# Patient Record
Sex: Female | Born: 1960 | Hispanic: No
Health system: Southern US, Community
[De-identification: ages and names within clinical notes are randomized; demographics above are authoritative.]

---

## 1999-01-08 ENCOUNTER — Other Ambulatory Visit: Admission: RE | Admit: 1999-01-08 | Discharge: 1999-01-08 | Payer: Self-pay | Admitting: Obstetrics & Gynecology

## 1999-04-05 ENCOUNTER — Ambulatory Visit: Admission: RE | Admit: 1999-04-05 | Discharge: 1999-04-05 | Payer: Self-pay

## 2002-10-31 ENCOUNTER — Other Ambulatory Visit: Admission: RE | Admit: 2002-10-31 | Discharge: 2002-10-31 | Payer: Self-pay | Admitting: Obstetrics & Gynecology

## 2003-10-30 ENCOUNTER — Other Ambulatory Visit: Admission: RE | Admit: 2003-10-30 | Discharge: 2003-10-30 | Payer: Self-pay | Admitting: Obstetrics & Gynecology

## 2011-08-04 ENCOUNTER — Ambulatory Visit (HOSPITAL_BASED_OUTPATIENT_CLINIC_OR_DEPARTMENT_OTHER): Payer: Managed Care, Other (non HMO) | Attending: Otolaryngology | Admitting: Radiology

## 2011-08-04 VITALS — Ht 67.0 in | Wt 235.0 lb

## 2011-08-04 DIAGNOSIS — G4733 Obstructive sleep apnea (adult) (pediatric): Secondary | ICD-10-CM | POA: Insufficient documentation

## 2011-08-07 DIAGNOSIS — G4733 Obstructive sleep apnea (adult) (pediatric): Secondary | ICD-10-CM

## 2011-08-07 DIAGNOSIS — R0989 Other specified symptoms and signs involving the circulatory and respiratory systems: Secondary | ICD-10-CM

## 2011-08-07 DIAGNOSIS — R0609 Other forms of dyspnea: Secondary | ICD-10-CM

## 2011-08-08 NOTE — Procedures (Signed)
NAMEROBIE, OATS               ACCOUNT NO.:  [id number]  MEDICAL RECORD NO.:  [id number]          PATIENT TYPE:  OUT  LOCATION:  SLEEP CENTER                 FACILITY:  [facility]  PHYSICIAN:  Zacaria Pousson D. Maple Hudson, MD, FCCP, FACPDATE OF BIRTH:  1960/09/23  DATE OF STUDY:  08/04/2011                           NOCTURNAL POLYSOMNOGRAM  REFERRING PHYSICIAN:  Zola Button T. Lazarus Salines, M.D.  INDICATION FOR STUDY:  Hypersomnia with sleep apnea.  EPWORTH SLEEPINESS SCORE:  18/24.  BMI 36.8.  Weight 235 pounds.  Height 67 inches.  Neck 13.5 inch.  MEDICATIONS:  Home medications charted and reviewed.  SLEEP ARCHITECTURE:  Total sleep time 339 minutes with sleep efficiency 91.5%.  Stage I was 4.9%, stage II 78.2%, stage III 0.3%.  REM 16.7% of total sleep time.  Sleep latency 8 minutes.  REM latency 90.5 minutes. Awake after sleep onset 23.5 minutes.  Arousal index 28.5.  BEDTIME MEDICATION:  None.  RESPIRATORY DATA:  Apnea-hypotony index (AHI) 11.9 per hour.  A total of 67 events were scored including 7 obstructive apneas, 2 central apneas, 58 hypopneas.  Events were more commonly seen while supine.  REM AHI 31.9 per hour.  There were insufficient numbers of early events to permit application of split protocol, CPAP titration on the study night.  OXYGEN DATA:  Moderately loud snoring (described by the technician as loud, wet, gurgling, and continuous) with oxygen desaturation to a nadir of 77% and mean saturation through the study of 95% on room air.  CARDIAC DATA:  Sinus rhythm with occasional PAC and PVC.  MOVEMENT-PARASOMNIA:  No significant movement disturbance.  No bathroom trips.  IMPRESSIONS-RECOMMENDATIONS: 1. Mild obstructive sleep apnea/hypopnea syndrome, apnea-hypopnea     index 11.9 per hour with mainly supine and rapid eye movement     associated events.  Moderately loud snoring described by the     technician as "loud, wet, gurgling, continuous snoring" with oxygen  desaturation to a nadir of 77% and mean oxygen saturation through     the study of 95% on room air. 2. There were insufficient numbers of early events to meet protocol     requirements for application of split study     protocol, CPAP titration on this study night.  Consider return for     dedicated CPAP titration study or evaluate for alternative     management as clinically appropriate.     Kenady Doxtater D. Maple Hudson, MD, [facility], FACP Diplomate, American Board of Sleep Medicine    CDY/MEDQ  D:  08/07/2011 12:49:04  T:  08/08/2011 03:48:15  Job:  [id number]

## 2011-09-05 ENCOUNTER — Ambulatory Visit (HOSPITAL_BASED_OUTPATIENT_CLINIC_OR_DEPARTMENT_OTHER): Payer: Managed Care, Other (non HMO) | Attending: Otolaryngology | Admitting: General Practice

## 2011-09-05 VITALS — Ht 67.0 in | Wt 235.0 lb

## 2011-09-05 DIAGNOSIS — Z9989 Dependence on other enabling machines and devices: Secondary | ICD-10-CM

## 2011-09-05 DIAGNOSIS — G4733 Obstructive sleep apnea (adult) (pediatric): Secondary | ICD-10-CM

## 2011-09-05 DIAGNOSIS — G4730 Sleep apnea, unspecified: Secondary | ICD-10-CM | POA: Insufficient documentation

## 2011-09-05 DIAGNOSIS — G4710 Hypersomnia, unspecified: Secondary | ICD-10-CM | POA: Insufficient documentation

## 2011-09-11 DIAGNOSIS — G4730 Sleep apnea, unspecified: Secondary | ICD-10-CM

## 2011-09-11 DIAGNOSIS — G4710 Hypersomnia, unspecified: Secondary | ICD-10-CM

## 2011-09-11 NOTE — Procedures (Signed)
NAMEMERRYL, BUCKELS               ACCOUNT NO.:  [id number]  MEDICAL RECORD NO.:  [id number]          PATIENT TYPE:  OUT  LOCATION:  SLEEP CENTER                 FACILITY:  [facility]  PHYSICIAN:  Abdurahman Rugg D. Maple Hudson, MD, FCCP, FACPDATE OF BIRTH:  10[birth year]Aug-1960  DATE OF STUDY:  09/05/2011                           NOCTURNAL POLYSOMNOGRAM  REFERRING PHYSICIAN:  Zola Button T. Lazarus Salines, M.D.  REFERRING PHYSICIAN:  Zola Button T. Wolicki, MD  INDICATION FOR STUDY:  Hypersomnia with sleep apnea.  EPWORTH SLEEPINESS SCORE:  18/24.  BMI 36.8, weight 235 pounds, height 67 inches, neck 13.5 inches.  MEDICATIONS:  Home medications are charted and reviewed.  A baseline diagnostic NPSG on August 04, 2011, recorded an AHI of 11.9 per hour.  CPAP titration is now requested.  SLEEP ARCHITECTURE:  Total sleep time 414 minutes with sleep efficiency 94.2%.  Stage I was 3%, stage II 65.9%, stage III 1.9%, REM 29.1% of total sleep time.  Sleep latency 6 minutes, REM latency 115.5 minutes, awake after sleep onset 19.5 minutes, arousal index 7.8.  Bedtime medication:  None.  RESPIRATORY DATA:  CPAP titration protocol.  CPAP was titrated to 9 CWP, AHI 1 per hour.  She wore a standard ResMed Mirage FX mask with heated humidifier and a C-Flex setting of 3.  OXYGEN DATA:  Snoring was prevented by final CPAP pressures and mean oxygen saturation held 96.2% on room air.  CARDIAC DATA:  Sinus rhythm with rare PVC.  MOVEMENT-PARASOMNIA:  No significant movement disturbance.  No bathroom trips.  IMPRESSIONS-RECOMMENDATION: 1. Successful continuous positive airway pressure titration to 9 cm of     water pressure, apnea/hypopnea index 1 per hour.  She wore a     standard ResMed Mirage FX full-face mask with heated humidifier and     a C-Flex setting of 3.  Snoring was prevented and mean oxygen     saturation held 96.2% on room air. 2. Baseline diagnostic nocturnal polysomnogram on August 04, 2011,     had recorded an  apnea/hypopnea index of 11.9 per hour.     Jessup Ogas D. Maple Hudson, MD, [facility], FACP Diplomate, American Board of Sleep Medicine    CDY/MEDQ  D:  09/11/2011 11:44:40  T:  09/11/2011 12:22:36  Job:  [id number]

## 2016-07-01 ENCOUNTER — Other Ambulatory Visit (HOSPITAL_BASED_OUTPATIENT_CLINIC_OR_DEPARTMENT_OTHER): Payer: Self-pay

## 2016-07-01 DIAGNOSIS — R0683 Snoring: Secondary | ICD-10-CM

## 2016-07-01 DIAGNOSIS — G4730 Sleep apnea, unspecified: Secondary | ICD-10-CM

## 2016-07-27 ENCOUNTER — Ambulatory Visit (HOSPITAL_BASED_OUTPATIENT_CLINIC_OR_DEPARTMENT_OTHER): Payer: Managed Care, Other (non HMO) | Attending: Otolaryngology | Admitting: Internal Medicine

## 2016-07-27 DIAGNOSIS — G4733 Obstructive sleep apnea (adult) (pediatric): Secondary | ICD-10-CM | POA: Insufficient documentation

## 2016-07-27 DIAGNOSIS — R0683 Snoring: Secondary | ICD-10-CM

## 2016-07-27 DIAGNOSIS — G4730 Sleep apnea, unspecified: Secondary | ICD-10-CM

## 2016-07-27 DIAGNOSIS — R0902 Hypoxemia: Secondary | ICD-10-CM | POA: Insufficient documentation

## 2016-08-01 DIAGNOSIS — R0683 Snoring: Secondary | ICD-10-CM

## 2016-08-01 NOTE — Procedures (Signed)
   Patient Name: Katelyn Mccarty, [name] Study Date: 07/27/2016 Gender: Female D.O.B: Dec 19, 1960 Age (years): 55 Referring Provider: Flo ShanksKarol Wolicki Height (inches): 65 Interpreting Physician: Jetty Duhamellinton Jaire Pinkham MD, ABSM Weight (lbs): 205 RPSGT:  [place], [place] BMI: 34 MRN: [id number] Neck Size: 14.50 CLINICAL INFORMATION Sleep Study Type: HST  Indication for sleep study: Snoring  Epworth Sleepiness Score: 20  SLEEP STUDY TECHNIQUE A multi-channel overnight portable sleep study was performed. The channels recorded were: nasal airflow, thoracic respiratory movement, and oxygen saturation with a pulse oximetry. Snoring was also monitored.  MEDICATIONS Patient self administered medications include: none reported during study  SLEEP ARCHITECTURE Patient was studied for 554.0 minutes. The sleep efficiency was 99.1 % and the patient was supine for 53.4%. The arousal index was 0.0 per hour.  RESPIRATORY PARAMETERS The overall AHI was 34.2 per hour, with a central apnea index of 0.0 per hour.  The oxygen nadir was 76% during sleep.  CARDIAC DATA Mean heart rate during sleep was 68.7 bpm.  IMPRESSIONS - Severe obstructive sleep apnea occurred during this study (AHI = 34.2/h). - No significant central sleep apnea occurred during this study (CAI = 0.0/h). - Severe oxygen desaturation was noted during this study (Min O2 = 76%, Mean 94%). - Patient snored .  DIAGNOSIS - Obstructive Sleep Apnea (327.23 [G47.33 ICD-10]) - Nocturnal Hypoxemia (327.26 [G47.36 ICD-10])  RECOMMENDATIONS - Return to provider to discuss treatment options. - Positional therapy avoiding supine position during sleep. - Avoid alcohol, sedatives and other CNS depressants that may worsen sleep apnea and disrupt normal sleep architecture. - Sleep hygiene should be reviewed to assess factors that may improve sleep quality. - Weight management and regular exercise should be initiated or continued.  [Electronically  signed] 08/01/2016 01:14 PM  Jetty Duhamellinton Juan Olthoff MD, ABSM Diplomate, American Board of Sleep Medicine   NPI: [id number] Waymon BudgeYOUNG,Panda Crossin D Diplomate, American Board of Sleep Medicine  ELECTRONICALLY SIGNED ON:  08/01/2016, 1:10 PM [facility] PH: [phone]   FX: [phone] ACCREDITED BY THE AMERICAN ACADEMY OF SLEEP MEDICINE

## 2019-01-25 ENCOUNTER — Other Ambulatory Visit: Payer: Self-pay | Admitting: Gastroenterology

## 2019-01-25 DIAGNOSIS — R112 Nausea with vomiting, unspecified: Secondary | ICD-10-CM

## 2019-02-02 ENCOUNTER — Ambulatory Visit
Admission: RE | Admit: 2019-02-02 | Discharge: 2019-02-02 | Disposition: A | Discharge status: Home / Self Care | Payer: 59 | Source: Ambulatory Visit | Attending: Gastroenterology | Admitting: Gastroenterology

## 2019-02-02 ENCOUNTER — Other Ambulatory Visit: Payer: Self-pay | Admitting: Gastroenterology

## 2019-02-02 DIAGNOSIS — R112 Nausea with vomiting, unspecified: Secondary | ICD-10-CM

## 2020-08-26 IMAGING — RF UPPER GI W/ SMALL BOWEL
5 series · 13 of 24 positions shown · non-contrast
Comparison: Abdominopelvic CT 01/27/2017.

CLINICAL DATA: Intractable nausea and vomiting. History of
laparoscopic gastric banding, cholecystectomy and hernia repair.

EXAM:
UPPER GI SERIES WITH SMALL BOWEL FOLLOW-THROUGH
FLUOROSCOPY TIME:  Fluoroscopy Time: 2 minutes and 42 seconds of
low-dose pulsed fluoroscopy.
Radiation Exposure Index (if provided by the fluoroscopic device):
493.8 mGy
Number of Acquired Spot Images: 0
TECHNIQUE: Combined double contrast and single contrast upper GI series using
effervescent crystals, thick barium, and thin barium. Subsequently,
serial images of the small bowel were obtained including spot views
of the terminal ileum.

[Series 1: one shot · 0.14mm/px · 3 of 12 slices shown (1 of 3)]
[im 1/12]
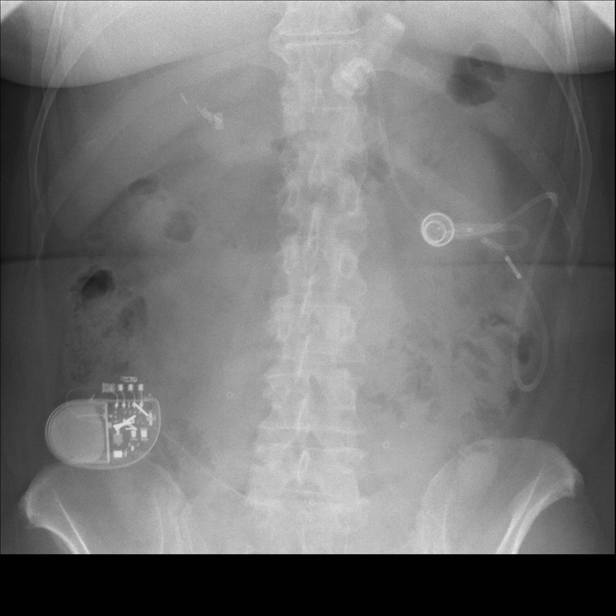
[im 6/12]
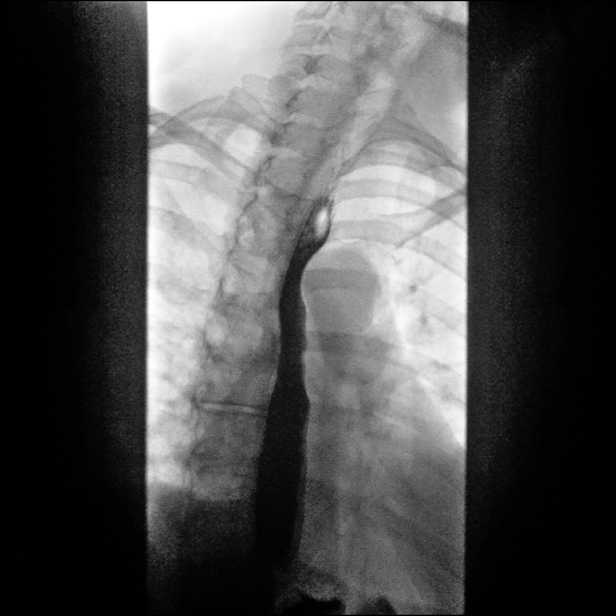
[im 10/12]
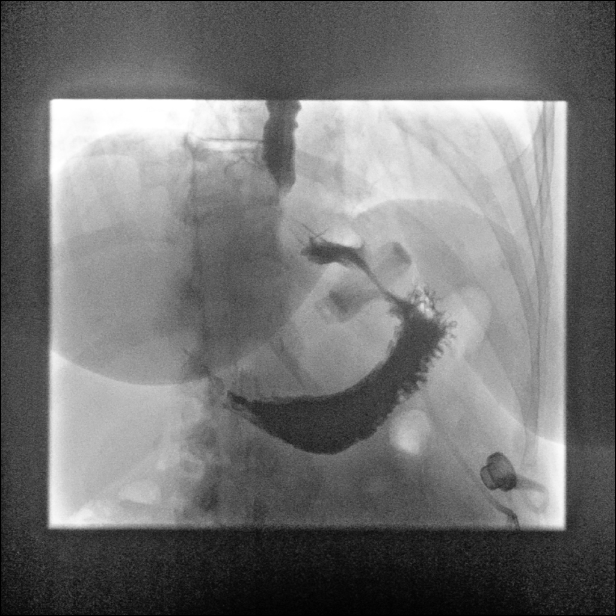

[Series 2: sequence · 2 of 26 frames shown (1 of 2)]
[frame 14/26]
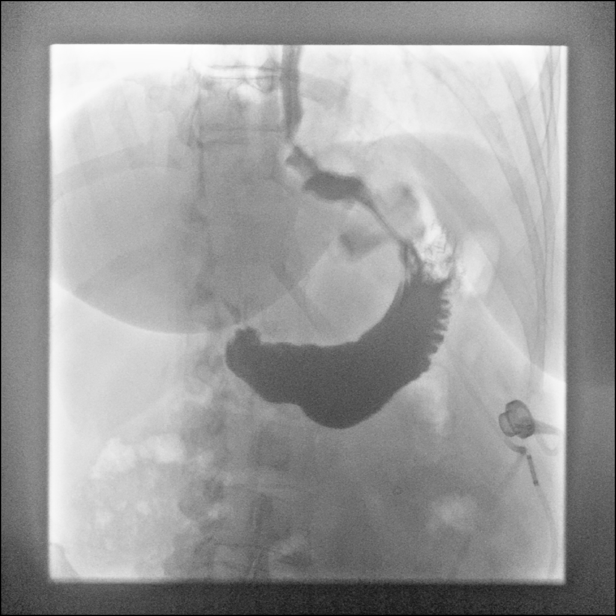
[frame 23/26]
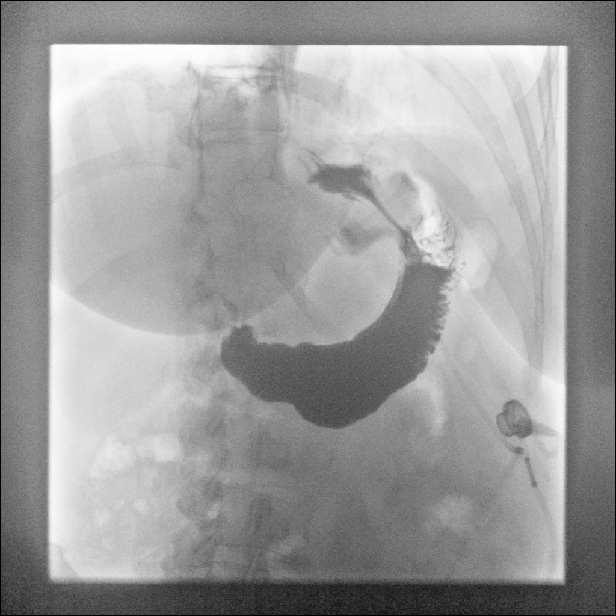

[Series 3: one shot · 2 of 8 slices shown (2 of 3)]
[im 4/8]
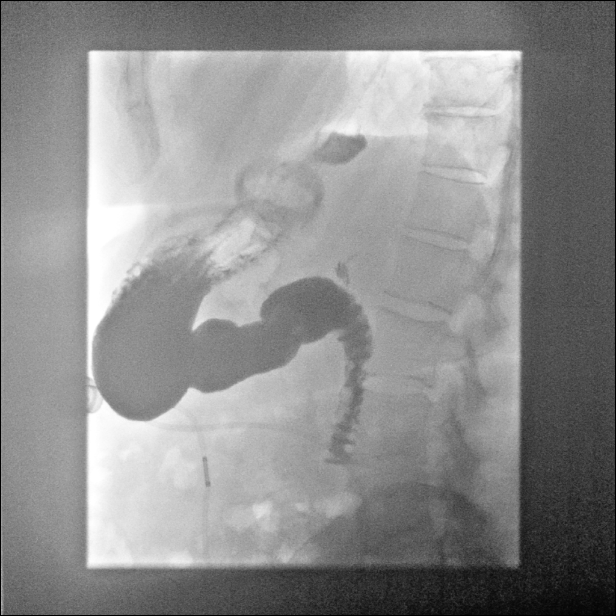
[im 8/8]
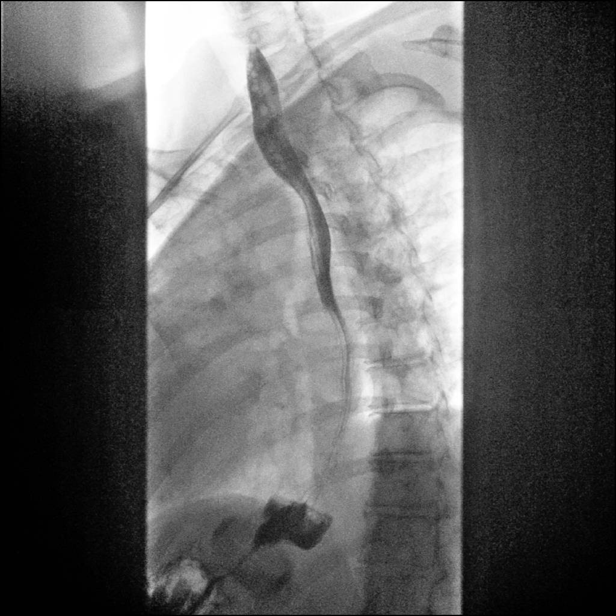

[Series 4: sequence · 2 of 49 frames shown (2 of 2)]
[frame 8/49]
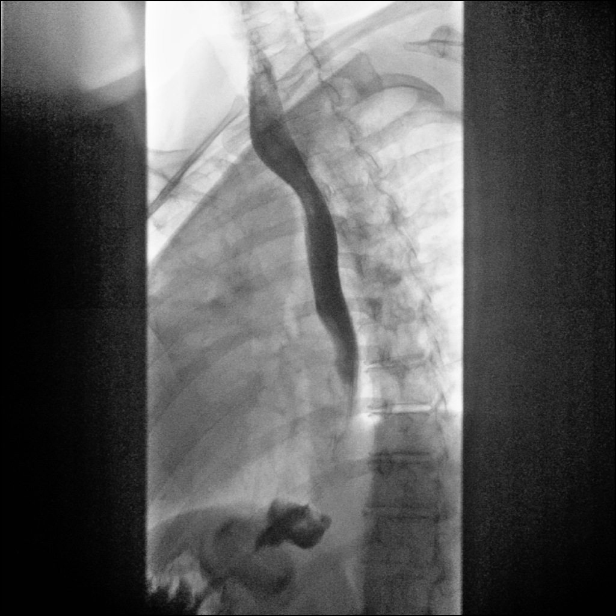
[frame 42/49]
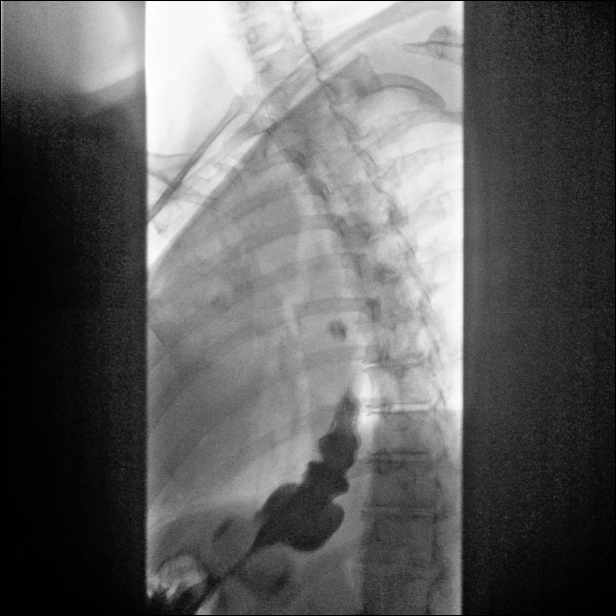

[Series 5: one shot · 4 of 17 slices shown (3 of 3)]
[im 2/17]
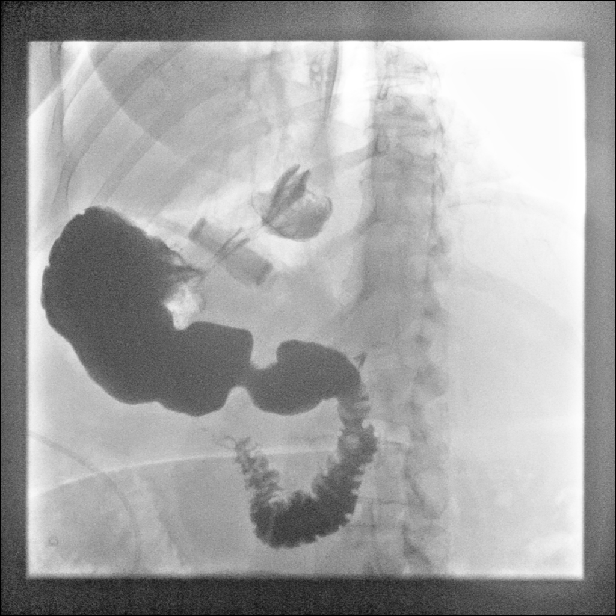
[im 8/17]
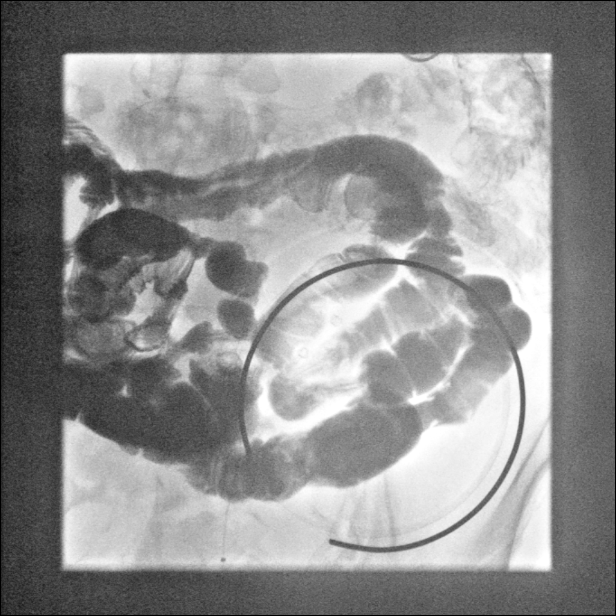
[im 11/17]
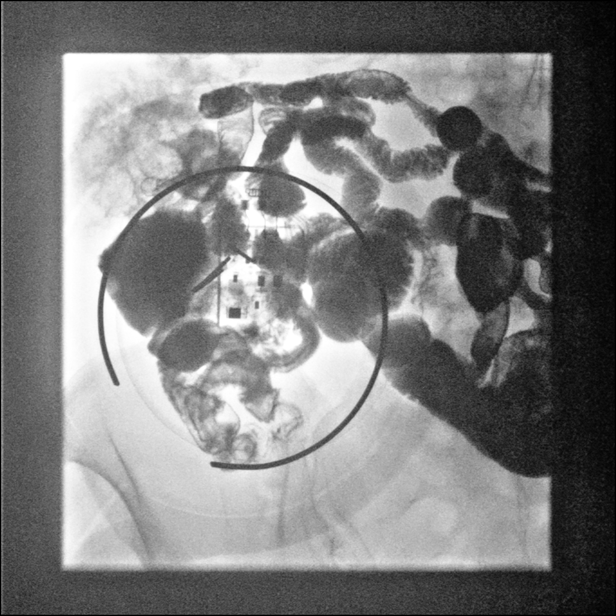
[im 17/17]
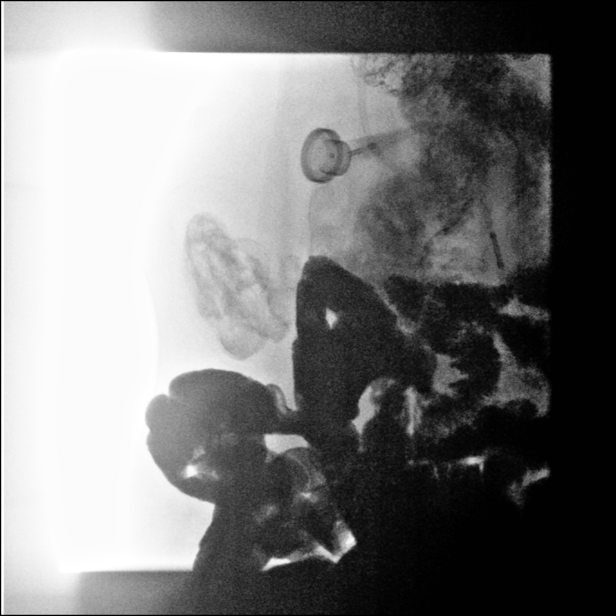

[13 of 24 positions shown; findings below may reference images not displayed]

FINDINGS: The scout abdominal radiograph demonstrates a normal bowel gas
pattern. The laparoscopic gastric band is appropriately oriented,
and is tubing is intact. Generator for a sacral spinal stimulator is
present in the right lower quadrant.

The patient swallowed the barium without difficulty. The esophageal
motility is within normal limits, and there is no stricture, mass or
ulceration. There is a small hiatal hernia just above the
laparoscopic gastric band. The band appears somewhat restrictive
with limited passage of barium into the stomach.

Eventually, the stomach was adequately opacified. No gastric
ulceration identified. The proximal small bowel appears normal.

Additional barium was administered and serial overhead abdominal
radiographs were obtained. The barium reaches the right colon
approximately 90 minutes after initial ingestion. Subsequent
fluoroscopy was performed at this time, demonstrating no focal
small-bowel abnormality. It was difficult to isolate the terminal
ileum, although no regional abnormality is identified. The patient
was concerned about a possible anterior abdominal wall hernia. I
evaluated her concern in the right lateral decubitus position. There
is no bowel protruding into the area of concern superiorly at the
level of the laparoscopic band port. More inferiorly, there is
anterior protuberance of small bowel and colon. Based on prior CT,
this may reflect extension into the previously demonstrated ventral
hernia. There is no evidence of incarceration.
IMPRESSION: 1. Normal esophageal motility with small hiatal hernia.
2. The laparoscopic gastric band appears restrictive with limited
drainage into the stomach.
3. The stomach and small bowel demonstrate no focal abnormalities.
Small bowel transit time is within normal limits.
4. Possible extension of small bowel and colon into a ventral
abdominal wall hernia. No evidence of bowel obstruction or
incarceration.
